# Patient Record
Sex: Male | Born: 1995 | Race: White | Hispanic: No | Marital: Single | Smoking: Never smoker
Health system: Southern US, Community
[De-identification: ages and names within clinical notes are randomized; demographics above are authoritative.]

## PROBLEM LIST (undated history)

## (undated) HISTORY — PX: TYMPANOSTOMY TUBE PLACEMENT: SHX32

## (undated) HISTORY — PX: OTHER SURGICAL HISTORY: SHX169

## (undated) HISTORY — PX: TONSILLECTOMY: SUR1361

---
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
## 2016-12-19 ENCOUNTER — Emergency Department (HOSPITAL_COMMUNITY)
Admission: EM | Admit: 2016-12-19 | Discharge: 2016-12-19 | Disposition: A | Discharge status: Home / Self Care | Payer: 59 | Attending: Emergency Medicine | Admitting: Emergency Medicine

## 2016-12-19 ENCOUNTER — Encounter (HOSPITAL_COMMUNITY): Payer: Self-pay | Admitting: Emergency Medicine

## 2016-12-19 DIAGNOSIS — R220 Localized swelling, mass and lump, head: Secondary | ICD-10-CM

## 2016-12-19 MED ORDER — PREDNISONE 10 MG PO TABS: ORAL_TABLET | ORAL | 0 refills | Status: AC

## 2016-12-19 NOTE — ED Provider Notes (Signed)
[facility]EM[facility]ENCY DEPT Provider Note   CSN: [id number] Arrival date & time: 12/19/16  1333     History   Chief Complaint Chief Complaint  Patient presents with  . Oral Swelling    HPI Dillon Sanchez is a 21 y.o. male.  The history is provided by the patient. No language interpreter was used.  Allergic Reaction  Presenting symptoms: swelling   Severity:  Moderate Duration:  1 day Prior allergic episodes:  No prior episodes Relieved by:  Nothing Worsened by:  Nothing Ineffective treatments:  None tried Pt reports his upper lip swelled last pm.   Pt reports swelling has gone down some with benadryl but he still has some swelling.  History reviewed. No pertinent past medical history.  Patient Active Problem List   Diagnosis Date Noted  . Ankle sprain 01/06/2012    Past Surgical History:  Procedure Laterality Date  . TONSILLECTOMY    . TYMPANOSTOMY TUBE PLACEMENT         Home Medications    Prior to Admission medications   Medication Sig Start Date End Date Taking? Authorizing Provider  predniSONE (DELTASONE) 10 MG tablet 6,5,4,3,2,1 taper 12/19/16   Elson Areas, PA-C    Family History Family History  Problem Relation Age of Onset  . Lung disease Unknown   . Arthritis Unknown   . Cancer Unknown   . Asthma Unknown     Social History Social History  Substance Use Topics  . Smoking status: Never Smoker  . Smokeless tobacco: Current User    Types: Chew  . Alcohol use Yes     Comment: occ     Allergies   Cephalosporins; Penicillins; and Sulfa antibiotics   Review of Systems Review of Systems  All other systems reviewed and are negative.    Physical Exam Updated Vital Signs BP (!) 146/81 (BP Location: Left Arm)   Pulse 63   Temp 98.1 F (36.7 C) (Oral)   Resp 18   Ht 5\' 7"  (1.702 m)   Wt 95.7 kg (211 lb)   SpO2 99%   BMI 33.05 kg/m   Physical Exam  Constitutional: He appears well-developed and well-nourished.  HENT:  Head:  Normocephalic.  Right Ear: External ear normal.  Left Ear: External ear normal.  Nose: Nose normal.  Mouth/Throat: Oropharynx is clear and moist.  Slight swelling mid upper lip    Eyes: Pupils are equal, round, and reactive to light. EOM are normal.  Cardiovascular: Normal rate.   Pulmonary/Chest: Effort normal.  Abdominal: Soft.  Musculoskeletal: Normal range of motion.  Neurological: He is alert.  Skin: Skin is warm.  Nursing note and vitals reviewed.    ED Treatments / Results  Labs (all labs ordered are listed, but only abnormal results are displayed) Labs Reviewed - No data to display  EKG  EKG Interpretation None       Radiology No results found.  Procedures Procedures (including critical care time)  Medications Ordered in ED Medications - No data to display   Initial Impression / Assessment and Plan / ED Course  I have reviewed the triage vital signs and the nursing notes.  Pertinent labs & imaging results that were available during my care of the patient were reviewed by me and considered in my medical decision making (see chart for details).      Final Clinical Impressions(s) / ED Diagnoses   Final diagnoses:  Swelling of upper lip    New Prescriptions Discharge Medication List as of  12/19/2016  2:33 PM    START taking these medications   Details  predniSONE (DELTASONE) 10 MG tablet 6,5,4,3,2,1 taper, Print      An After Visit Summary was printed and given to the patient.   Elson AreasSofia, Leslie K, PA-C 12/19/16 1511    Samuel JesterMcManus, Kathleen, DO 12/23/16 [phone]

## 2016-12-19 NOTE — ED Triage Notes (Signed)
Pt reports lip swelling that started at midline of top lip yesterday. Pt reports swelling progressed to entire lip last night but reports took benadryl x2 and reports swelling is now back to midline of upper lip. Airway patent. nad noted. Pt denies any new self-care products.

## 2017-02-11 ENCOUNTER — Encounter (HOSPITAL_COMMUNITY): Payer: Self-pay | Admitting: *Deleted
